# Patient Record
Sex: Male | Born: 1970 | Race: White | Hispanic: No | Marital: Married | Smoking: Never smoker
Health system: Southern US, Community
[De-identification: ages and names within clinical notes are randomized; demographics above are authoritative.]

---

## 2014-12-19 ENCOUNTER — Other Ambulatory Visit: Payer: Self-pay

## 2014-12-19 ENCOUNTER — Encounter: Payer: Self-pay | Admitting: Emergency Medicine

## 2014-12-19 DIAGNOSIS — I10 Essential (primary) hypertension: Secondary | ICD-10-CM | POA: Insufficient documentation

## 2014-12-19 DIAGNOSIS — R079 Chest pain, unspecified: Secondary | ICD-10-CM | POA: Insufficient documentation

## 2014-12-19 NOTE — ED Notes (Signed)
Pt. States he woke at around 22:45 this evening with lt. Chest pain that radiates to lt. Shoulder.  Pt. States when he woke he vomited multiple time.  Pt. States same thing happened on Monday of this week.  Pt. States at that time he had slurred speech with dizziness.

## 2014-12-20 ENCOUNTER — Emergency Department
Admission: EM | Admit: 2014-12-20 | Discharge: 2014-12-20 | Disposition: A | Discharge status: Home / Self Care | Payer: BLUE CROSS/BLUE SHIELD | Attending: Emergency Medicine | Admitting: Emergency Medicine

## 2014-12-20 ENCOUNTER — Emergency Department: Payer: BLUE CROSS/BLUE SHIELD

## 2014-12-20 DIAGNOSIS — R079 Chest pain, unspecified: Secondary | ICD-10-CM

## 2014-12-20 LAB — COMPREHENSIVE METABOLIC PANEL
ALBUMIN: 3.9 g/dL (ref 3.5–5.0)
ALK PHOS: 41 U/L (ref 38–126)
ALT: 40 U/L (ref 17–63)
AST: 32 U/L (ref 15–41)
Anion gap: 8 (ref 5–15)
BILIRUBIN TOTAL: 0.4 mg/dL (ref 0.3–1.2)
BUN: 24 mg/dL — AB (ref 6–20)
CALCIUM: 9.3 mg/dL (ref 8.9–10.3)
CO2: 22 mmol/L (ref 22–32)
Chloride: 109 mmol/L (ref 101–111)
Creatinine, Ser: 1.25 mg/dL — ABNORMAL HIGH (ref 0.61–1.24)
GFR calc Af Amer: >60 mL/min (ref >60)
GFR calc non Af Amer: >60 mL/min (ref >60)
GLUCOSE: 133 mg/dL — AB (ref 65–99)
Potassium: 4.1 mmol/L (ref 3.5–5.1)
SODIUM: 139 mmol/L (ref 135–145)
TOTAL PROTEIN: 6.7 g/dL (ref 6.5–8.1)

## 2014-12-20 LAB — CBC WITH DIFFERENTIAL/PLATELET
BASOS PCT: 1 %
Basophils Absolute: 0.1 10*3/uL (ref 0–0.1)
EOS ABS: 0.4 10*3/uL (ref 0–0.7)
Eosinophils Relative: 4 %
HEMATOCRIT: 42.3 % (ref 40.0–52.0)
HEMOGLOBIN: 13.5 g/dL (ref 13.0–18.0)
LYMPHS ABS: 5.1 10*3/uL — AB (ref 1.0–3.6)
Lymphocytes Relative: 41 %
MCH: 27.1 pg (ref 26.0–34.0)
MCHC: 31.9 g/dL — AB (ref 32.0–36.0)
MCV: 85 fL (ref 80.0–100.0)
MONO ABS: 0.9 10*3/uL (ref 0.2–1.0)
MONOS PCT: 8 %
Neutro Abs: 5.8 10*3/uL (ref 1.4–6.5)
Neutrophils Relative %: 46 %
Platelets: 285 10*3/uL (ref 150–440)
RBC: 4.98 MIL/uL (ref 4.40–5.90)
RDW: 13.8 % (ref 11.5–14.5)
WBC: 12.4 10*3/uL — ABNORMAL HIGH (ref 3.8–10.6)

## 2014-12-20 LAB — TROPONIN I: Troponin I: <0.03 ng/mL (ref <0.031)

## 2014-12-20 NOTE — Discharge Instructions (Signed)
Return to the ER for recurrent or worsening symptoms, persistent vomiting, difficulty breathing or other concerns.  Chest Pain (Nonspecific) It is often hard to give a specific diagnosis for the cause of chest pain. There is always a chance that your pain could be related to something serious, such as a heart attack or a blood clot in the lungs. You need to follow up with your health care provider for further evaluation. CAUSES   Heartburn.  Pneumonia or bronchitis.  Anxiety or stress.  Inflammation around your heart (pericarditis) or lung (pleuritis or pleurisy).  A blood clot in the lung.  A collapsed lung (pneumothorax). It can develop suddenly on its own (spontaneous pneumothorax) or from trauma to the chest.  Shingles infection (herpes zoster virus). The chest wall is composed of bones, muscles, and cartilage. Any of these can be the source of the pain.  The bones can be bruised by injury.  The muscles or cartilage can be strained by coughing or overwork.  The cartilage can be affected by inflammation and become sore (costochondritis). DIAGNOSIS  Lab tests or other studies may be needed to find the cause of your pain. Your health care provider may have you take a test called an ambulatory electrocardiogram (ECG). An ECG records your heartbeat patterns over a 24-hour period. You may also have other tests, such as:  Transthoracic echocardiogram (TTE). During echocardiography, sound waves are used to evaluate how blood flows through your heart.  Transesophageal echocardiogram (TEE).  Cardiac monitoring. This allows your health care provider to monitor your heart rate and rhythm in real time.  Holter monitor. This is a portable device that records your heartbeat and can help diagnose heart arrhythmias. It allows your health care provider to track your heart activity for several days, if needed.  Stress tests by exercise or by giving medicine that makes the heart beat  faster. TREATMENT   Treatment depends on what may be causing your chest pain. Treatment may include:  Acid blockers for heartburn.  Anti-inflammatory medicine.  Pain medicine for inflammatory conditions.  Antibiotics if an infection is present.  You may be advised to change lifestyle habits. This includes stopping smoking and avoiding alcohol, caffeine, and chocolate.  You may be advised to keep your head raised (elevated) when sleeping. This reduces the chance of acid going backward from your stomach into your esophagus. Most of the time, nonspecific chest pain will improve within 2-3 days with rest and mild pain medicine.  HOME CARE INSTRUCTIONS   If antibiotics were prescribed, take them as directed. Finish them even if you start to feel better.  For the next few days, avoid physical activities that bring on chest pain. Continue physical activities as directed.  Do not use any tobacco products, including cigarettes, chewing tobacco, or electronic cigarettes.  Avoid drinking alcohol.  Only take medicine as directed by your health care provider.  Follow your health care provider's suggestions for further testing if your chest pain does not go away.  Keep any follow-up appointments you made. If you do not go to an appointment, you could develop lasting (chronic) problems with pain. If there is any problem keeping an appointment, call to reschedule. SEEK MEDICAL CARE IF:   Your chest pain does not go away, even after treatment.  You have a rash with blisters on your chest.  You have a fever. SEEK IMMEDIATE MEDICAL CARE IF:   You have increased chest pain or pain that spreads to your arm, neck, jaw, back,  or abdomen.  You have shortness of breath.  You have an increasing cough, or you cough up blood.  You have severe back or abdominal pain.  You feel nauseous or vomit.  You have severe weakness.  You faint.  You have chills. This is an emergency. Do not wait to  see if the pain will go away. Get medical help at once. Call your local emergency services (911 in [place] Do not drive yourself to the hospital. MAKE SURE YOU:   Understand these instructions.  Will watch your condition.  Will get help right away if you are not doing well or get worse. Document Released: 01/20/2005 Document Revised: 04/17/2013 Document Reviewed: 11/16/2007 [facility] Patient Information 2015 [place], [place]. This information is not intended to replace advice given to you by your health care provider. Make sure you discuss any questions you have with your health care provider.

## 2014-12-20 NOTE — ED Provider Notes (Signed)
[facility] Emergency Department Provider Note  ____________________________________________  Time seen: Approximately 3:25 AM  I have reviewed the triage vital signs and the nursing notes.   HISTORY  Chief Complaint Chest Pain    HPI Kevin Shah is a 44 y.o. 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 States he had an episode of chest pain on Tuesday which involved a 4 minute episode of left axillary burning. At that time he felt like his speech was slurred and his legs were numb. Symptoms resolved without intervention. This evening approximately 11 PM he was at rest and felt left axillary burning again for 4 minutes which resolved without intervention. Patient denied nausea and vomiting to me. Denies associated diaphoresis, shortness of breath, palpitations, dizziness. Does state he drinks a lot of energy drinks. Denies fever, chills, cough, abdominal pain, headache, weakness. Denies chest pain currently.   Past medical history GERD Hypertension  History reviewed. No pertinent past surgical history.  No current outpatient prescriptions on file. Patient states he takes a prescription blood pressure medication (cannot recall name) and over-the-counter medicine for GERD.  Allergies Azithromycin  Family History  Problem Relation Age of Onset  . Heart failure Mother     Social History Social History  Substance Use Topics  . Smoking status: Never Smoker   . Smokeless tobacco: None  . Alcohol Use: No  Denies illicit drug use.  Review of Systems Constitutional: No fever/chills Eyes: No visual changes. ENT: No sore throat. Cardiovascular: Positive for chest pain. Respiratory: Denies shortness of breath. Gastrointestinal: No abdominal pain.  No nausea, no vomiting.  No diarrhea.  No constipation. Genitourinary: Negative for  dysuria. Musculoskeletal: Negative for back pain. Skin: Negative for rash. Neurological: Negative for headaches, focal weakness or numbness.  10-point ROS otherwise negative.  ____________________________________________   PHYSICAL EXAM:  VITAL SIGNS: ED Triage Vitals  Enc Vitals Group     BP 12/19/14 2337 154/92 mmHg     Pulse Rate 12/20/14 0230 75     Resp 12/19/14 2337 18     Temp 12/19/14 2337 98.1 F (36.7 C)     Temp src --      SpO2 12/19/14 2337 96 %     Weight 12/19/14 2337 248 lb (112.492 kg)     Height 12/19/14 2337  (1.753 m)     Head Cir --      Peak Flow --      Pain Score 12/19/14 2338 0     Pain Loc --      Pain Edu? --      Excl. in GC? --     Constitutional: Alert and oriented. Well appearing and in no acute distress. Eyes: Conjunctivae are normal. PERRL. EOMI. Head: Atraumatic. Nose: No congestion/rhinnorhea. Mouth/Throat: Mucous membranes are moist.  Oropharynx non-erythematous. Neck: No stridor.   Cardiovascular: Normal rate, regular rhythm. Grossly normal heart sounds.  Good peripheral circulation. Respiratory: Normal respiratory effort.  No retractions. Lungs CTAB. Gastrointestinal: Soft and nontender. No distention. No abdominal bruits. No CVA tenderness. Musculoskeletal: No lower extremity tenderness nor edema.  No joint effusions. Neurologic:  Normal speech and language. No gross focal neurologic deficits are appreciated. No gait instability. Skin:  Skin is warm, dry and intact. No rash noted. Skin specifically examined under left axilla. Psychiatric: Mood and affect are normal. Speech and behavior are normal.  ____________________________________________   LABS (  all labs ordered are listed, but only abnormal results are displayed)  Labs Reviewed  COMPREHENSIVE METABOLIC PANEL - Abnormal; Notable for the following:    Glucose, Bld 133 (*)    BUN 24 (*)    Creatinine, Ser 1.25 (*)    All other components within normal limits  CBC  WITH DIFFERENTIAL/PLATELET - Abnormal; Notable for the following:    WBC 12.4 (*)    MCHC 31.9 (*)    Lymphs Abs 5.1 (*)    All other components within normal limits  TROPONIN I   ____________________________________________  EKG  ED ECG REPORT I, Shubh Chiara J, the attending physician, personally viewed and interpreted this ECG.   Date: 12/20/2014  EKG Time: 2342  Rate: 71  Rhythm: normal EKG, normal sinus rhythm  Axis: Normal  Intervals:none  ST&T Change: Nonspecific  ____________________________________________  RADIOLOGY  Chest 2 view (viewed by me, interpreted per Dr. Manus Gunning): No acute pulmonary process. ____________________________________________   PROCEDURES  Procedure(s) performed: None  Critical Care performed: No  ____________________________________________   INITIAL IMPRESSION / ASSESSMENT AND PLAN / ED COURSE  Pertinent labs & imaging results that were available during my care of the patient were reviewed by me and considered in my medical decision making (see chart for details).  44 year old male who presents with a 4 minute episode of left axillary burning. Low risk stratification for acute coronary syndrome and pulmonary embolus. Initial laboratory, imaging results and EKG are reassuring. I discussed with patient and recommended repeat troponin as well as check a CK. Patient declines because he has to pick up his coworkers from the [employer] to go to work. I strongly urged patient to follow-up with his doctor when he returns home this weekend. I also urged him to return to the ED if he experiences recurrent or worsening symptoms. Strict return precautions given. Patient verbalizes understanding and agrees with plan of care. ____________________________________________   FINAL CLINICAL IMPRESSION(S) / ED DIAGNOSES  Final diagnoses:  Chest pain, unspecified chest pain type      Irean Hong, MD 12/20/14 [id number]

## 2014-12-20 NOTE — ED Notes (Signed)
Pt returns from Xray.

## 2016-04-05 IMAGING — CR DG CHEST 2V
2 series · 2 of 2 positions shown · non-contrast
Comparison: None.

CLINICAL DATA: Awoke 3 hours prior with chest pain. Left-sided pain
radiating into the shoulder. Vomiting.

EXAM:
CHEST  2 VIEW

[chest pa]
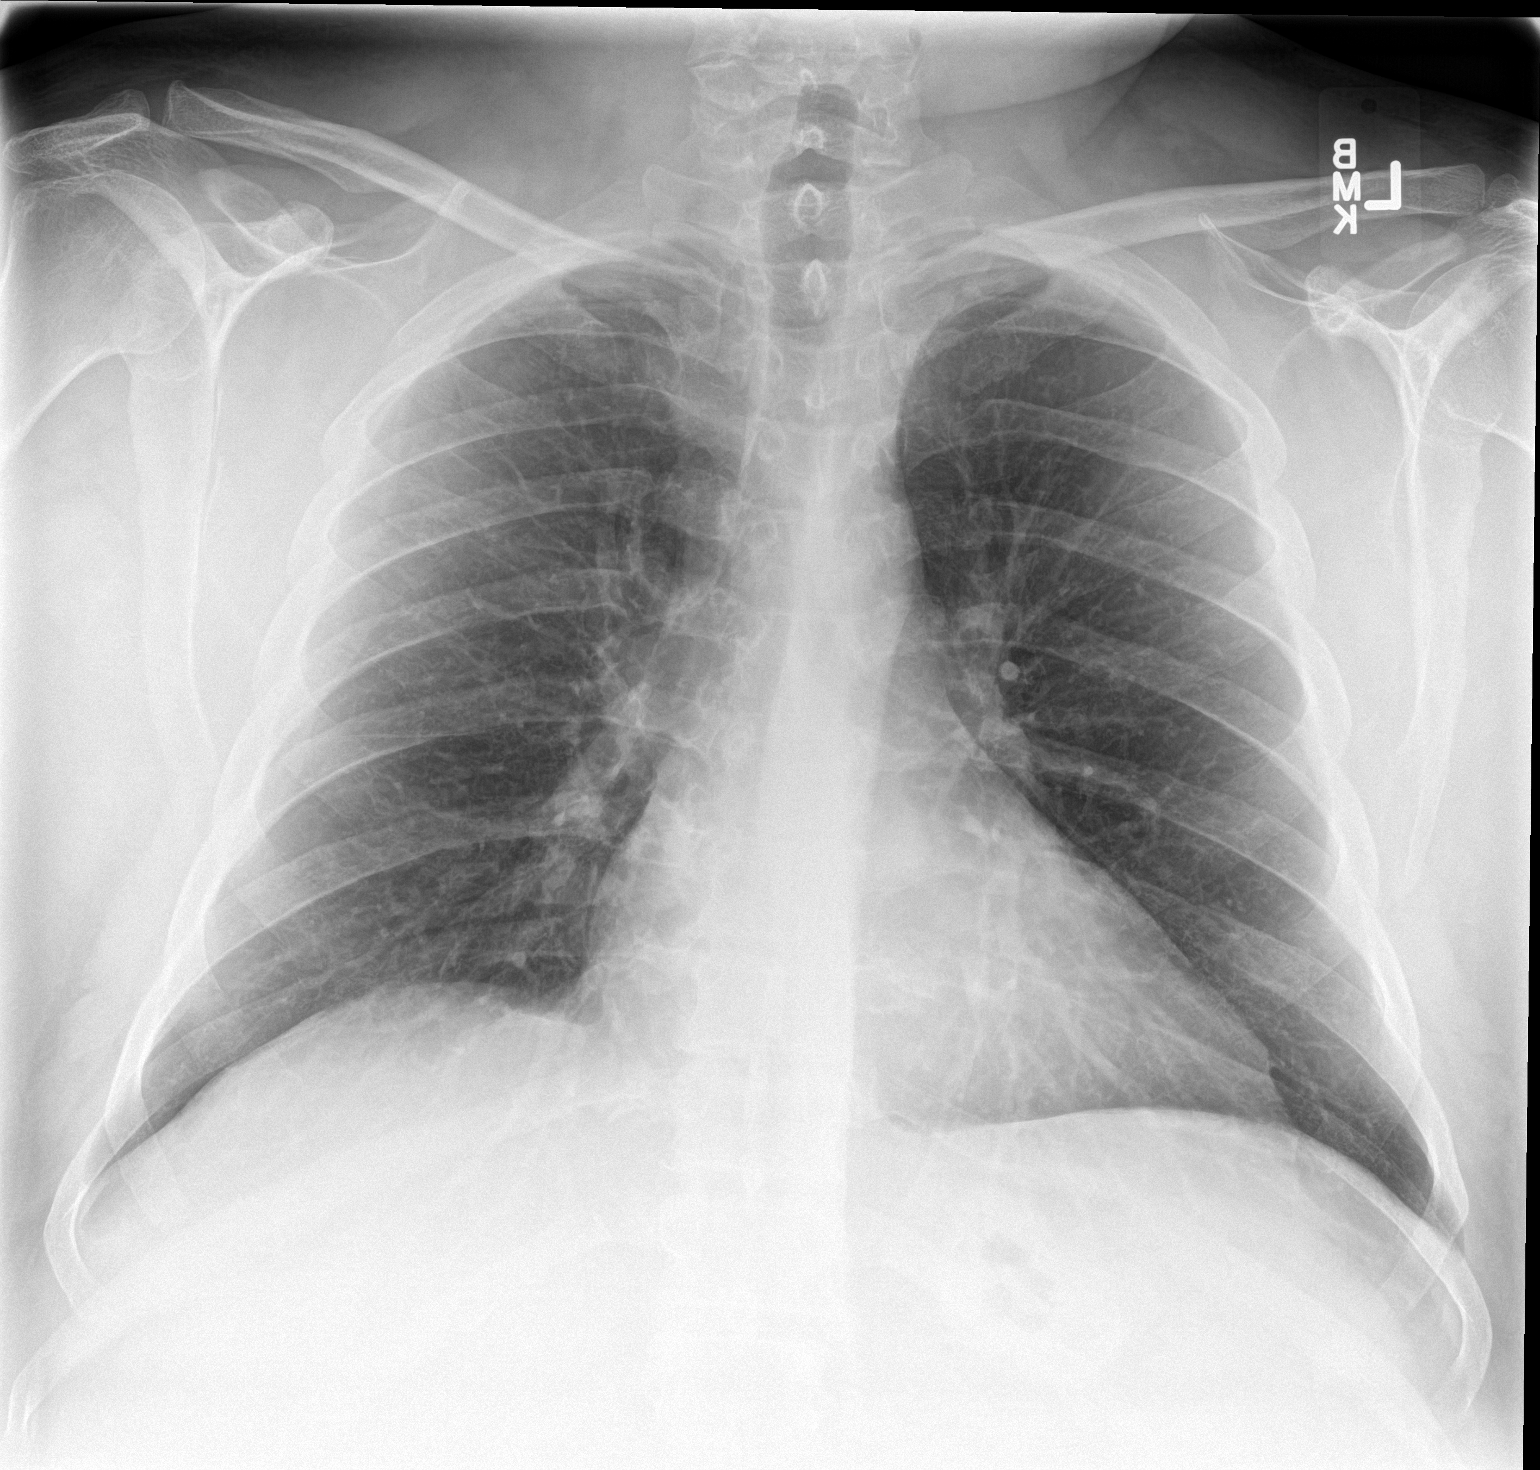

[chest lat]
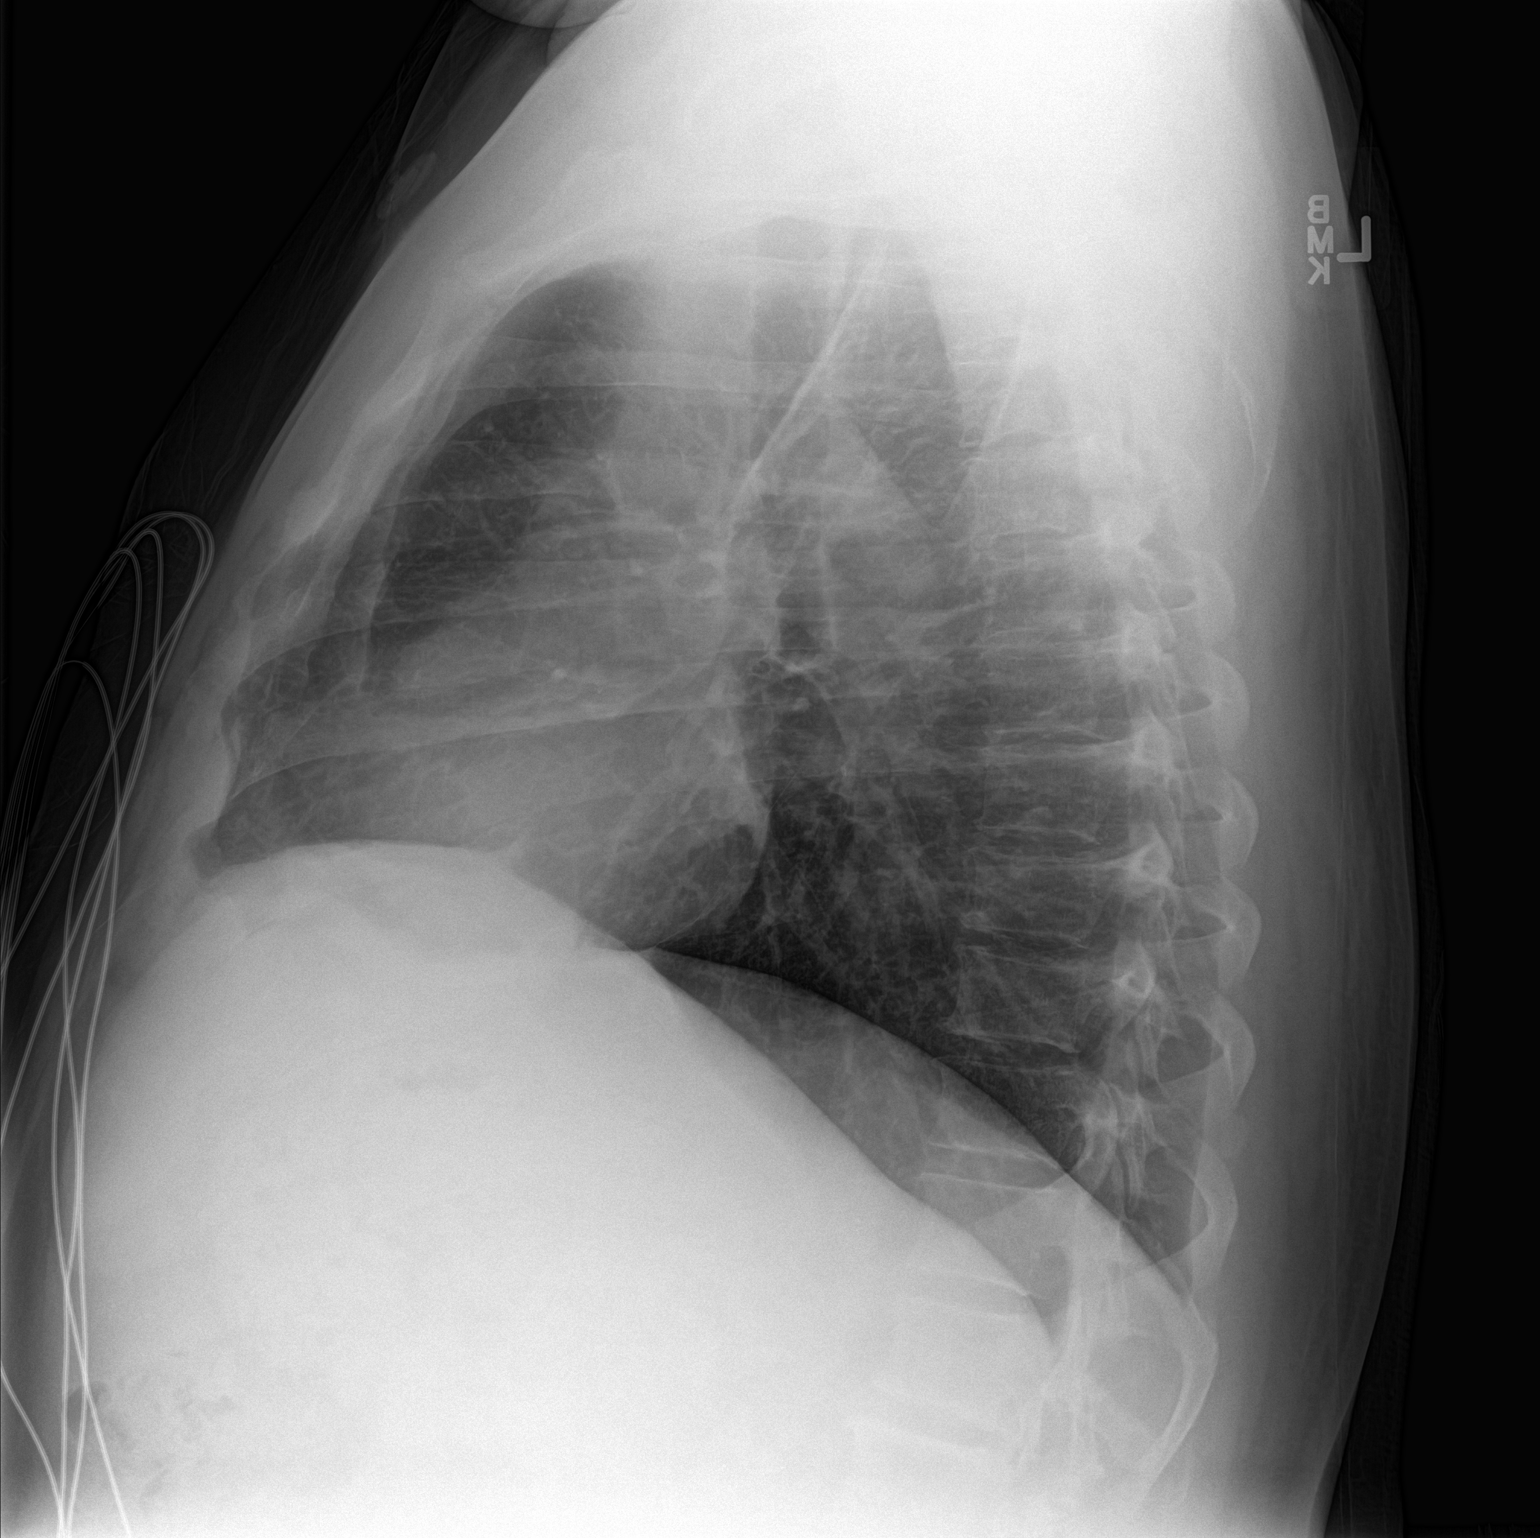

[2 of 2 positions shown; findings below may reference images not displayed]

FINDINGS: The cardiomediastinal contours are normal. The lungs are clear.
Pulmonary vasculature is normal. No consolidation, pleural effusion,
or pneumothorax. No acute osseous abnormalities are seen.
IMPRESSION: No acute pulmonary process.
